# Patient Record
Sex: Female | Born: 1975 | Race: Black or African American | Hispanic: No | Marital: Single | Smoking: Never smoker
Health system: Southern US, Community
[De-identification: ages and names within clinical notes are randomized; demographics above are authoritative.]

## PROBLEM LIST (undated history)

## (undated) DIAGNOSIS — E669 Obesity, unspecified: Secondary | ICD-10-CM

---

## 2002-07-06 ENCOUNTER — Emergency Department (HOSPITAL_COMMUNITY): Admission: EM | Admit: 2002-07-06 | Discharge: 2002-07-07 | Payer: Self-pay | Admitting: Emergency Medicine

## 2006-07-17 ENCOUNTER — Other Ambulatory Visit: Admission: RE | Admit: 2006-07-17 | Discharge: 2006-07-17 | Payer: Self-pay | Admitting: Family Medicine

## 2012-01-25 ENCOUNTER — Ambulatory Visit: Payer: BC Managed Care – PPO

## 2012-01-25 ENCOUNTER — Ambulatory Visit (INDEPENDENT_AMBULATORY_CARE_PROVIDER_SITE_OTHER): Payer: BC Managed Care – PPO | Admitting: Family Medicine

## 2012-01-25 VITALS — BP 110/77 | HR 84 | Temp 97.4°F | Resp 16

## 2012-01-25 DIAGNOSIS — R52 Pain, unspecified: Secondary | ICD-10-CM

## 2012-01-25 DIAGNOSIS — W19XXXA Unspecified fall, initial encounter: Secondary | ICD-10-CM

## 2012-01-25 DIAGNOSIS — S93409A Sprain of unspecified ligament of unspecified ankle, initial encounter: Secondary | ICD-10-CM

## 2012-01-25 DIAGNOSIS — M25571 Pain in right ankle and joints of right foot: Secondary | ICD-10-CM

## 2012-01-25 DIAGNOSIS — S93401A Sprain of unspecified ligament of right ankle, initial encounter: Secondary | ICD-10-CM

## 2012-01-25 DIAGNOSIS — M25579 Pain in unspecified ankle and joints of unspecified foot: Secondary | ICD-10-CM

## 2012-01-25 NOTE — Progress Notes (Signed)
   [address]   [place], [place]  [address]   [id number]  Subjective:    Patient ID: Carol Bullock, female    DOB: 02/11/1976, 36 y.o.   MRN: [id number]  HPIThis 36 y.o. female presents for evaluation of ankle pain. Larey Seat one week ago and injured ankle.  Walking to class and twisted; twisted R ankle.  +swelling.  +limping.  +entire foot hurts.  +tingling and numbness in foot.  No cyanosis.  No medications.  Minimal pain immediately but pain developed the following day.  Wrapping in ace bandage.  +icing ankle.  No elevation.  Still walking on ankle.  +previous ankle injury in past; sprain in past.  PCP:  none PMH:  Regular menses; LMP this week. Psurg:  none All: none Medications:  None Social: Tree surgeon debt.    Review of Systems  Constitutional: Negative for fever, chills and diaphoresis.  Musculoskeletal: Positive for arthralgias and gait problem.  Neurological: Positive for numbness. Negative for weakness.  Hematological: Does not bruise/bleed easily.       Objective:   Physical Exam  Nursing note and vitals reviewed. Constitutional: She is oriented to person, place, and time. She appears well-developed and well-nourished. No distress.  Cardiovascular: Intact distal pulses.        DP PULSE 2+.  CAPILLARY REFILL < 3 SECONDS.  Musculoskeletal: She exhibits tenderness.       Right ankle: She exhibits decreased range of motion and swelling. She exhibits no ecchymosis, no deformity, no laceration and normal pulse. tenderness. Lateral malleolus and head of 5th metatarsal tenderness found. No medial malleolus and no proximal fibula tenderness found. Achilles tendon exhibits no pain and no defect.       +PAIN WITH DORSIFLEXION, PLANTAR FLEXION, INTERNAL AND EXTERNAL ROTATION R ANKLE; +TTP PROXIMAL 5TH METATARSAL; MILD EFFUSION LATERAL ANKLE.  NO TTP CALF SQUEEZE OR PALPATION.  Neurological: She is alert and oriented to person, place, and time. No sensory deficit.  Skin: Skin is  warm and dry. She is not diaphoretic. No erythema. No pallor.  Psychiatric: She has a normal mood and affect. Her behavior is normal. Judgment and thought content normal.      [facility] reading (PRIMARY) by  Dr. Katrinka Blazing.  R ankle: NAD.  R foot:  NAD.      Assessment & Plan:   1. Fall  DG Ankle Complete Right, DG Foot Complete Right  2. Pain  DG Ankle Complete Right, DG Foot Complete Right  3. Right ankle sprain    4. Right ankle pain       1.  R lateral ankle pain/sprain:  New.  Rest, ice, elevate, NSAIDS scheduled for next week.  Sweedo lace up brace provided to wear daily for 1-2 weeks.  Avoid wearing high heeled shoes for two weeks; supportive shoes with ambulation for next two weeks. Home exercises provided to perform daily.  RTC two weeks if no improvement.

## 2012-01-25 NOTE — Patient Instructions (Addendum)
1. Fall  DG Ankle Complete Right, DG Foot Complete Right  2. Pain  DG Ankle Complete Right, DG Foot Complete Right  3. Right ankle sprain    4. Right ankle pain     Acute Ankle Sprain with Phase I Rehab An acute ankle sprain is a partial or complete tear in one or more of the ligaments of the ankle due to traumatic injury. The severity of the injury depends on both the the number of ligaments sprained and the grade of sprain. There are 3 grades of sprains.   A grade 1 sprain is a mild sprain. There is a slight pull without obvious tearing. There is no loss of strength, and the muscle and ligament are the correct length.   A grade 2 sprain is a moderate sprain. There is tearing of fibers within the substance of the ligament where it connects two bones or two cartilages. The length of the ligament is increased, and there is usually decreased strength.   A grade 3 sprain is a complete rupture of the ligament and is uncommon.  In addition to the grade of sprain, there are three types of ankle sprains.  Lateral ankle sprains: This is a sprain of one or more of the three ligaments on the outer side (lateral) of the ankle. These are the most common sprains. Medial ankle sprains: There is one large triangular ligament of the inner side (medial) of the ankle that is susceptible to injury. Medial ankle sprains are less common. Syndesmosis, "high ankle," sprains: The syndesmosis is the ligament that connects the two bones of the lower leg. Syndesmosis sprains usually only occur with very severe ankle sprains. SYMPTOMS  Pain, tenderness, and swelling in the ankle, starting at the side of injury that may progress to the whole ankle and foot with time.   "Pop" or tearing sensation at the time of injury.   Bruising that may spread to the heel.   Impaired ability to walk soon after injury.  CAUSES   Acute ankle sprains are caused by trauma placed on the ankle that temporarily forces or pries the  anklebone (talus) out of its normal socket.   Stretching or tearing of the ligaments that normally hold the joint in place (usually due to a twisting injury).  RISK INCREASES WITH:  Previous ankle sprain.   Sports in which the foot may land awkwardly (ie. basketball, volleyball, or soccer) or walking or running on uneven or rough surfaces.   Shoes with inadequate support to prevent sideways motion when stress occurs.   Poor strength and flexibility.   Poor balance skills.   Contact sports.  PREVENTION   Warm up and stretch properly before activity.   Maintain physical fitness:   Ankle and leg flexibility, muscle strength, and endurance.   Cardiovascular fitness.   Balance training activities.   Use proper technique and have a coach correct improper technique.   Taping, protective strapping, bracing, or high-top tennis shoes may help prevent injury. Initially, tape is best; however, it loses most of its support function within 10 to 15 minutes.   Wear proper fitted protective shoes (High-top shoes with taping or bracing is more effective than either alone).   Provide the ankle with support during sports and practice activities for 12 months following injury.  PROGNOSIS   If treated properly, ankle sprains can be expected to recover completely; however, the length of recovery depends on the degree of injury.   A grade 1 sprain usually heals  enough in 5 to 7 days to allow modified activity and requires an average of 6 weeks to heal completely.   A grade 2 sprain requires 6 to 10 weeks to heal completely.   A grade 3 sprain requires 12 to 16 weeks to heal.   A syndesmosis sprain often takes more than 3 months to heal.  RELATED COMPLICATIONS   Frequent recurrence of symptoms may result in a chronic problem. Appropriately addressing the problem the first time decreases the frequency of recurrence and optimizes healing time. Severity of the initial sprain does not predict the  likelihood of later instability.   Injury to other structures (bone, cartilage, or tendon).   A chronically unstable or arthritic ankle joint is a possiblity with repeated sprains.  TREATMENT Treatment initially involves the use of ice, medication, and compression bandages to help reduce pain and inflammation. Ankle sprains are usually immobilized in a walking cast or boot to allow for healing. Crutches may be recommended to reduce pressure on the injury. After immobilization, strengthening and stretching exercises may be necessary to regain strength and a full range of motion. Surgery is rarely needed to treat ankle sprains. MEDICATION   Nonsteroidal anti-inflammatory medications, such as aspirin and ibuprofen (do not take for the first 3 days after injury or within 7 days before surgery), or other minor pain relievers, such as acetaminophen, are often recommended. Take these as directed by your caregiver. Contact your caregiver immediately if any bleeding, stomach upset, or signs of an allergic reaction occur from these medications.   Ointments applied to the skin may be helpful.   Pain relievers may be prescribed as necessary by your caregiver. Do not take prescription pain medication for longer than 4 to 7 days. Use only as directed and only as much as you need.  HEAT AND COLD  Cold treatment (icing) is used to relieve pain and reduce inflammation for acute and chronic cases. Cold should be applied for 10 to 15 minutes every 2 to 3 hours for inflammation and pain and immediately after any activity that aggravates your symptoms. Use ice packs or an ice massage.   Heat treatment may be used before performing stretching and strengthening activities prescribed by your caregiver. Use a heat pack or a warm soak.  SEEK IMMEDIATE MEDICAL CARE IF:   Pain, swelling, or bruising worsens despite treatment.   You experience pain, numbness, discoloration, or coldness in the foot or toes.   New,  unexplained symptoms develop (drugs used in treatment may produce side effects.)  EXERCISES  PHASE I EXERCISES RANGE OF MOTION (ROM) AND STRETCHING EXERCISES - Ankle Sprain, Acute Phase I, Weeks 1 to 2 These exercises may help you when beginning to restore flexibility in your ankle. You will likely work on these exercises for the 1 to 2 weeks after your injury. Once your physician, physical therapist, or athletic trainer sees adequate progress, he or she will advance your exercises. While completing these exercises, remember:   Restoring tissue flexibility helps normal motion to return to the joints. This allows healthier, less painful movement and activity.   An effective stretch should be held for at least 30 seconds.   A stretch should never be painful. You should only feel a gentle lengthening or release in the stretched tissue.  RANGE OF MOTION - Dorsi/Plantar Flexion  While sitting with your right / left knee straight, draw the top of your foot upwards by flexing your ankle. Then reverse the motion, pointing your toes downward.  Hold each position for __________ seconds.   After completing your first set of exercises, repeat this exercise with your knee bent.  Repeat __________ times. Complete this exercise __________ times per day.  RANGE OF MOTION - Ankle Alphabet  Imagine your right / left big toe is a pen.   Keeping your hip and knee still, write out the entire alphabet with your "pen." Make the letters as large as you can without increasing any discomfort.  Repeat __________ times. Complete this exercise __________ times per day.  STRENGTHENING EXERCISES - Ankle Sprain, Acute -Phase I, Weeks 1 to 2 These exercises may help you when beginning to restore strength in your ankle. You will likely work on these exercises for 1 to 2 weeks after your injury. Once your physician, physical therapist, or athletic trainer sees adequate progress, he or she will advance your exercises. While  completing these exercises, remember:   Muscles can gain both the endurance and the strength needed for everyday activities through controlled exercises.   Complete these exercises as instructed by your physician, physical therapist, or athletic trainer. Progress the resistance and repetitions only as guided.   You may experience muscle soreness or fatigue, but the pain or discomfort you are trying to eliminate should never worsen during these exercises. If this pain does worsen, stop and make certain you are following the directions exactly. If the pain is still present after adjustments, discontinue the exercise until you can discuss the trouble with your clinician.  STRENGTH - Dorsiflexors  Secure a rubber exercise band/tubing to a fixed object (ie. table, pole) and loop the other end around your right / left foot.   Sit on the floor facing the fixed object. The band/tubing should be slightly tense when your foot is relaxed.   Slowly draw your foot back toward you using your ankle and toes.   Hold this position for __________ seconds. Slowly release the tension in the band and return your foot to the starting position.  Repeat __________ times. Complete this exercise __________ times per day.  STRENGTH - Plantar-flexors   Sit with your right / left leg extended. Holding onto both ends of a rubber exercise band/tubing, loop it around the ball of your foot. Keep a slight tension in the band.   Slowly push your toes away from you, pointing them downward.   Hold this position for __________ seconds. Return slowly, controlling the tension in the band/tubing.  Repeat __________ times. Complete this exercise __________ times per day.  STRENGTH - Ankle Eversion  Secure one end of a rubber exercise band/tubing to a fixed object (table, pole). Loop the other end around your foot just before your toes.   Place your fists between your knees. This will focus your strengthening at your ankle.    Drawing the band/tubing across your opposite foot, slowly, pull your little toe out and up. Make sure the band/tubing is positioned to resist the entire motion.   Hold this position for __________ seconds.  Have your muscles resist the band/tubing as it slowly pulls your foot back to the starting position.  Repeat __________ times. Complete this exercise __________ times per day.  STRENGTH - Ankle Inversion  Secure one end of a rubber exercise band/tubing to a fixed object (table, pole). Loop the other end around your foot just before your toes.   Place your fists between your knees. This will focus your strengthening at your ankle.   Slowly, pull your big toe up and in,  making sure the band/tubing is positioned to resist the entire motion.   Hold this position for __________ seconds.   Have your muscles resist the band/tubing as it slowly pulls your foot back to the starting position.  Repeat __________ times. Complete this exercises __________ times per day.  STRENGTH - Towel Curls  Sit in a chair positioned on a non-carpeted surface.   Place your right / left foot on a towel, keeping your heel on the floor.   Pull the towel toward your heel by only curling your toes. Keep your heel on the floor.   If instructed by your physician, physical therapist, or athletic trainer, add weight to the end of the towel.  Repeat __________ times. Complete this exercise __________ times per day. Document Released: 11/23/2004 Document Revised: 04/13/2011 Document Reviewed: 08/06/2008 [facility] Patient Information 2012 [place], [place].

## 2012-01-29 NOTE — Progress Notes (Signed)
Reviewed and agree.

## 2018-12-13 ENCOUNTER — Telehealth: Payer: Self-pay | Admitting: General Practice

## 2018-12-13 NOTE — Telephone Encounter (Signed)
Tried to call but was unable to leave a message on any phone

## 2018-12-13 NOTE — Telephone Encounter (Signed)
Copied from [place] [id number]. Topic: Appointment Scheduling - Scheduling Inquiry for Clinic >> Dec 12, 2018  9:49 AM Erick Blinks wrote: Reason for CRM: Pt called requesting to est care with Dr. Volanda Napoleon, tried calling office  Best contact: [phone]

## 2019-08-28 ENCOUNTER — Emergency Department (HOSPITAL_COMMUNITY): Payer: BC Managed Care – PPO

## 2019-08-28 ENCOUNTER — Observation Stay (HOSPITAL_COMMUNITY)
Admission: EM | Admit: 2019-08-28 | Discharge: 2019-08-29 | Disposition: A | Discharge status: Home / Self Care | Payer: BC Managed Care – PPO | Attending: Internal Medicine | Admitting: Internal Medicine

## 2019-08-28 ENCOUNTER — Other Ambulatory Visit: Payer: Self-pay

## 2019-08-28 ENCOUNTER — Encounter (HOSPITAL_COMMUNITY): Payer: Self-pay | Admitting: Emergency Medicine

## 2019-08-28 DIAGNOSIS — E6609 Other obesity due to excess calories: Secondary | ICD-10-CM

## 2019-08-28 DIAGNOSIS — Z6837 Body mass index (BMI) 37.0-37.9, adult: Secondary | ICD-10-CM | POA: Diagnosis not present

## 2019-08-28 DIAGNOSIS — Z20822 Contact with and (suspected) exposure to covid-19: Secondary | ICD-10-CM | POA: Diagnosis not present

## 2019-08-28 DIAGNOSIS — R55 Syncope and collapse: Secondary | ICD-10-CM | POA: Diagnosis not present

## 2019-08-28 DIAGNOSIS — E669 Obesity, unspecified: Secondary | ICD-10-CM | POA: Diagnosis present

## 2019-08-28 HISTORY — DX: Obesity, unspecified: E66.9

## 2019-08-28 LAB — CBC WITH DIFFERENTIAL/PLATELET
Abs Immature Granulocytes: 0.01 10*3/uL (ref 0.00–0.07)
Basophils Absolute: 0.1 10*3/uL (ref 0.0–0.1)
Basophils Relative: 1 %
Eosinophils Absolute: 0.3 10*3/uL (ref 0.0–0.5)
Eosinophils Relative: 7 %
HCT: 40.7 % (ref 36.0–46.0)
Hemoglobin: 12.8 g/dL (ref 12.0–15.0)
Immature Granulocytes: 0 %
Lymphocytes Relative: 46 %
Lymphs Abs: 2.1 10*3/uL (ref 0.7–4.0)
MCH: 29 pg (ref 26.0–34.0)
MCHC: 31.4 g/dL (ref 30.0–36.0)
MCV: 92.3 fL (ref 80.0–100.0)
Monocytes Absolute: 0.4 10*3/uL (ref 0.1–1.0)
Monocytes Relative: 8 %
Neutro Abs: 1.8 10*3/uL (ref 1.7–7.7)
Neutrophils Relative %: 38 %
Platelets: 278 10*3/uL (ref 150–400)
RBC: 4.41 MIL/uL (ref 3.87–5.11)
RDW: 13.5 % (ref 11.5–15.5)
WBC: 4.7 10*3/uL (ref 4.0–10.5)
nRBC: 0 % (ref 0.0–0.2)

## 2019-08-28 LAB — HIV ANTIBODY (ROUTINE TESTING W REFLEX): HIV Screen 4th Generation wRfx: NONREACTIVE

## 2019-08-28 LAB — BASIC METABOLIC PANEL
Anion gap: 8 (ref 5–15)
BUN: 5 mg/dL — ABNORMAL LOW (ref 6–20)
CO2: 25 mmol/L (ref 22–32)
Calcium: 9.1 mg/dL (ref 8.9–10.3)
Chloride: 111 mmol/L (ref 98–111)
Creatinine, Ser: 0.7 mg/dL (ref 0.44–1.00)
GFR calc Af Amer: >60 mL/min (ref >60)
GFR calc non Af Amer: >60 mL/min (ref >60)
Glucose, Bld: 79 mg/dL (ref 70–99)
Potassium: 3.6 mmol/L (ref 3.5–5.1)
Sodium: 144 mmol/L (ref 135–145)

## 2019-08-28 LAB — I-STAT BETA HCG BLOOD, ED (MC, WL, AP ONLY): I-stat hCG, quantitative: <5 m[IU]/mL (ref <5)

## 2019-08-28 LAB — URINALYSIS, ROUTINE W REFLEX MICROSCOPIC
Bilirubin Urine: NEGATIVE
Glucose, UA: NEGATIVE mg/dL
Hgb urine dipstick: NEGATIVE
Ketones, ur: NEGATIVE mg/dL
Leukocytes,Ua: NEGATIVE
Nitrite: NEGATIVE
Protein, ur: NEGATIVE mg/dL
Specific Gravity, Urine: 1.003 — ABNORMAL LOW (ref 1.005–1.030)
pH: 6 (ref 5.0–8.0)

## 2019-08-28 LAB — CBG MONITORING, ED: Glucose-Capillary: 79 mg/dL (ref 70–99)

## 2019-08-28 LAB — TROPONIN I (HIGH SENSITIVITY)
Troponin I (High Sensitivity): 4 ng/L (ref <18)
Troponin I (High Sensitivity): <2 ng/L (ref <18)

## 2019-08-28 LAB — RESPIRATORY PANEL BY RT PCR (FLU A&B, COVID)
Influenza A by PCR: NEGATIVE
Influenza B by PCR: NEGATIVE
SARS Coronavirus 2 by RT PCR: NEGATIVE

## 2019-08-28 LAB — TSH: TSH: 0.644 u[IU]/mL (ref 0.350–4.500)

## 2019-08-28 LAB — GLUCOSE, CAPILLARY: Glucose-Capillary: 102 mg/dL — ABNORMAL HIGH (ref 70–99)

## 2019-08-28 LAB — D-DIMER, QUANTITATIVE: D-Dimer, Quant: 0.34 ug/mL-FEU (ref 0.00–0.50)

## 2019-08-28 LAB — MAGNESIUM: Magnesium: 1.9 mg/dL (ref 1.7–2.4)

## 2019-08-28 MED ORDER — ENOXAPARIN SODIUM 40 MG/0.4ML ~~LOC~~ SOLN
40.0000 mg | SUBCUTANEOUS | Status: DC
  Administered 2019-08-28 – 2019-08-29 (×2): 40 mg via SUBCUTANEOUS
  Filled 2019-08-28 (×2): qty 0.4

## 2019-08-28 MED ORDER — ACETAMINOPHEN 325 MG PO TABS
650.0000 mg | ORAL_TABLET | Freq: Four times a day (QID) | ORAL | Status: DC | PRN
  Administered 2019-08-29: 650 mg via ORAL
  Filled 2019-08-28: qty 2

## 2019-08-28 MED ORDER — ONDANSETRON HCL 4 MG PO TABS: 4.0000 mg | ORAL_TABLET | Freq: Four times a day (QID) | ORAL | Status: DC | PRN

## 2019-08-28 MED ORDER — ACETAMINOPHEN 650 MG RE SUPP: 650.0000 mg | Freq: Four times a day (QID) | RECTAL | Status: DC | PRN

## 2019-08-28 MED ORDER — SODIUM CHLORIDE 0.9 % IV SOLN: INTRAVENOUS | Status: DC

## 2019-08-28 MED ORDER — SODIUM CHLORIDE 0.9% FLUSH
3.0000 mL | Freq: Two times a day (BID) | INTRAVENOUS | Status: DC
  Administered 2019-08-28 – 2019-08-29 (×2): 3 mL via INTRAVENOUS

## 2019-08-28 MED ORDER — ALBUTEROL SULFATE (2.5 MG/3ML) 0.083% IN NEBU: 2.5000 mg | INHALATION_SOLUTION | Freq: Four times a day (QID) | RESPIRATORY_TRACT | Status: DC | PRN

## 2019-08-28 MED ORDER — ONDANSETRON HCL 4 MG/2ML IJ SOLN: 4.0000 mg | Freq: Four times a day (QID) | INTRAMUSCULAR | Status: DC | PRN

## 2019-08-28 NOTE — ED Notes (Signed)
Pt ambulated to br

## 2019-08-28 NOTE — ED Notes (Signed)
Attempted report x1. 

## 2019-08-28 NOTE — ED Triage Notes (Signed)
Pt arrives via EMS - Pt had a brief syncopal episode while at work. Her staff caught her as she was passing out. Pt did not hit the floor. Pt complains of mild dizziness. EMS reports that while they were helping her stand to get on stretcher, pt's legs went limp and they couldn't feel a radial pulse. Pt was still alert and remembers this second incident. BP 130/80 after the event, HR 70 regular, CBG 111, 100% on room air. No medical hx. Pt had her second covid shot two weeks ago.
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
## 2019-08-28 NOTE — H&P (Signed)
History and Physical    Carol Bullock QPY:[id number] DOB: Mar 08, 1976 DOA: 08/28/2019  Referring MD/NP/PA: Casimiro Needle tried: PCP: Osborn Coho, MD  Patient coming from: Work Via EMS  Chief Complaint: Passed out  I have personally briefly reviewed patient's old medical records in [facility]   HPI: Carol Bullock is a 44 y.o. female with past medical history significant for obesity presents after having a syncopal episode at work.  She reports that she was talking with coworkers when she felt herself get lightheaded, but was able to catch herself against a nearby wall.  However, when she let go of the wall she apparently lost consciousness and coworkers were able to catch her before she fell.  Thinks that she may have lost consciousness for only a couple seconds.  Associated symptoms included nausea and headache.  Denies having any chest pain, palpitations, nausea, vomiting, diarrhea, or leg swelling.  She had recently received the Pfizer vaccine for Covid 2 weeks ago.  She reports that she has been drinking plenty of fluids at least in the last few days because she has had muscle aches of both of her thighs. She makes note that her mother has history of possible atrial fibrillation or irregular heart rhythm. The only other recent changes is that she is switched to vegan and changed her eating habits.  ED Course: Upon admission into the emergency department patient was noted to have relatively normal vital signs.  Orthostatics vital signs were also noted to be negative.  Labs were relatively unremarkable except for low normal glucose of 70.  Urinalysis was negative for any acute abnormalities.  Chest x-ray noted low lung volumes with bibasilar atelectasis.  Influenza and COVID-19 screening were both negative.  TRH called to admit.  Review of Systems  Constitutional: Negative for fever.  HENT: Negative for nosebleeds and sinus pain.   Eyes: Negative for pain.  Respiratory: Negative for  cough and wheezing.   Cardiovascular: Negative for chest pain and palpitations.  Gastrointestinal: Positive for nausea. Negative for vomiting.  Genitourinary: Negative for dysuria and flank pain.  Musculoskeletal: Negative for back pain and falls.  Skin: Negative for itching and rash.  Neurological: Positive for dizziness, loss of consciousness and headaches.  Psychiatric/Behavioral: Negative for memory loss and substance abuse.    History reviewed. No pertinent past medical history.  History reviewed. No pertinent surgical history.   reports that she has never smoked. She has never used smokeless tobacco. She reports current alcohol use. She reports that she does not use drugs.  No Known Allergies  History reviewed. No pertinent family history.  Prior to Admission medications   Not on File    Physical Exam:  Constitutional: Middle-aged female who appears to be in no acute distress at this time. Vitals:   08/28/19 1015 08/28/19 1215 08/28/19 1300 08/28/19 1315  BP: 124/73 (!) 127/92 (!) 117/58 (!) 106/51  Pulse: (!) 58 75 66 61  Resp: 15 15 19  (!) 21  Temp:      TempSrc:      SpO2: 99% 100% 98% 96%  Weight:      Height:       Eyes: PERRL, lids and conjunctivae normal ENMT: Mucous membranes are moist. Posterior pharynx clear of any exudate or lesions. Neck: normal, supple, no masses, no thyromegaly Respiratory: clear to auscultation bilaterally, no wheezing, no crackles. Normal respiratory effort. No accessory muscle use.  Cardiovascular: Regular rate and rhythm, no murmurs / rubs / gallops. No extremity edema.  2+ pedal pulses. No carotid bruits.  Abdomen: no tenderness, no masses palpated. No hepatosplenomegaly. Bowel sounds positive.  Musculoskeletal: no clubbing / cyanosis. No joint deformity upper and lower extremities. Good ROM, no contractures. Normal muscle tone.  Skin: no rashes, lesions, ulcers. No induration Neurologic: CN 2-12 grossly intact. Sensation intact,  DTR normal. Strength 5/5 in all 4.  Psychiatric: Normal judgment and insight. Alert and oriented x 3. Normal mood.     Labs on Admission: I have personally reviewed following labs and imaging studies  CBC: Recent Labs  Lab 08/28/19 0952  WBC 4.7  NEUTROABS 1.8  HGB 12.8  HCT 40.7  MCV 92.3  PLT 937   Basic Metabolic Panel: Recent Labs  Lab 08/28/19 0952  NA 144  K 3.6  CL 111  CO2 25  GLUCOSE 79  BUN 5*  CREATININE 0.70  CALCIUM 9.1  MG 1.9   GFR: Estimated Creatinine Clearance: 107.4 mL/min (by C-G formula based on SCr of 0.7 mg/dL). Liver Function Tests: No results for input(s): AST, ALT, ALKPHOS, BILITOT, PROT, ALBUMIN in the last 168 hours. No results for input(s): LIPASE, AMYLASE in the last 168 hours. No results for input(s): AMMONIA in the last 168 hours. Coagulation Profile: No results for input(s): INR, PROTIME in the last 168 hours. Cardiac Enzymes: No results for input(s): CKTOTAL, CKMB, CKMBINDEX, TROPONINI in the last 168 hours. BNP (last 3 results) No results for input(s): PROBNP in the last 8760 hours. HbA1C: No results for input(s): HGBA1C in the last 72 hours. CBG: Recent Labs  Lab 08/28/19 1011  GLUCAP 79   Lipid Profile: No results for input(s): CHOL, HDL, LDLCALC, TRIG, CHOLHDL, LDLDIRECT in the last 72 hours. Thyroid Function Tests: No results for input(s): TSH, T4TOTAL, FREET4, T3FREE, THYROIDAB in the last 72 hours. Anemia Panel: No results for input(s): VITAMINB12, FOLATE, FERRITIN, TIBC, IRON, RETICCTPCT in the last 72 hours. Urine analysis:    Component Value Date/Time   COLORURINE STRAW (A) 08/28/2019 1015   APPEARANCEUR CLEAR 08/28/2019 1015   LABSPEC 1.003 (L) 08/28/2019 1015   PHURINE 6.0 08/28/2019 1015   GLUCOSEU NEGATIVE 08/28/2019 1015   HGBUR NEGATIVE 08/28/2019 1015   BILIRUBINUR NEGATIVE 08/28/2019 1015   KETONESUR NEGATIVE 08/28/2019 1015   PROTEINUR NEGATIVE 08/28/2019 1015   NITRITE NEGATIVE 08/28/2019 1015     LEUKOCYTESUR NEGATIVE 08/28/2019 1015   Sepsis Labs: No results found for this or any previous visit (from the past 240 hour(s)).   Radiological Exams on Admission: CT Head Wo Contrast  Result Date: 08/28/2019 CLINICAL DATA:  44 year old presenting with syncope. EXAM: CT HEAD WITHOUT CONTRAST TECHNIQUE: Contiguous axial images were obtained from the base of the skull through the vertex without intravenous contrast. COMPARISON:  None. FINDINGS: Brain: Head tilt in the gantry accounts for apparent asymmetry in the cerebral hemispheres. Ventricular system normal in size and appearance for age. No mass lesion. No midline shift. No acute hemorrhage or hematoma. No extra-axial fluid collections. No evidence of acute infarction. No focal brain parenchymal abnormalities. Calcifications/ossification involving the falx and the tentorium. Vascular: No hyperdense vessel. No visible atherosclerosis. Skull: No skull fracture or other focal osseous abnormality involving the skull. Sinuses/Orbits: Visualized paranasal sinuses, bilateral mastoid air cells and bilateral middle ear cavities well-aerated. Visualized orbits and globes normal in appearance. Other: None. IMPRESSION: No acute intracranial abnormality. Electronically Signed   By: Evangeline Dakin M.D.   On: 08/28/2019 11:36   DG Chest Portable 1 View  Result Date: 08/28/2019 CLINICAL DATA:  Patient with syncopal episode. EXAM: PORTABLE CHEST 1 VIEW COMPARISON:  None. FINDINGS: Monitoring leads overlie the patient. Cardiac contours upper limits of normal. Low lung volumes. Bibasilar heterogeneous opacities. No pleural effusion or pneumothorax. Rightward curvature of the thoracic spine. IMPRESSION: Low lung volumes with basilar opacities favored to represent atelectasis. Electronically Signed   By: Annia Belt M.D.   On: 08/28/2019 10:41    EKG: Independently reviewed.  Sinus rhythm at 61 bpm  Assessment/Plan Syncope: Acute.  Patient presents after having  a syncopal episode at work.  Denies any reports of chest pain.  Chest x-ray was clear and patient had negative orthostatic vital signs.  Troponins negative x2.  Reports changing to a vegan diet recently.  Initial blood sugars were at the lower end of normal.  Patient also noted history of arrhythmia in her mother.  Question cause of symptoms at this time as it is not totally clear. -Admit to a medical telemetry bed -Check echocardiogram -Normal saline IV fluids at 75 mL/h overnight -Check CBGs q. 4 hours x 4 with hypoglycemic protocol -Follow-up telemetry overnight -May benefit from being set up for outpatient Holter monitor  -Will need further education on restrictions driving and other safety measures if no clear cause is able to be found  Obesity: BMI of 37.44 kg/m  DVT prophylaxis: lovenox  Code Status: full  Family Communication: No family present at bedside Disposition Plan: Likely discharge home in a.m. depending on work-up Consults called: none  Admission status: Observation  Clydie Braun MD [facility] Pager [phone]   If 7PM-7AM, please contact night-coverage [email or web address] Password [email or web address]  08/28/2019, 2:25 PM

## 2019-08-28 NOTE — Progress Notes (Signed)
Attempted to call back ED nurse to get report but she gave a fax number.

## 2019-08-29 ENCOUNTER — Observation Stay (HOSPITAL_BASED_OUTPATIENT_CLINIC_OR_DEPARTMENT_OTHER): Payer: BC Managed Care – PPO

## 2019-08-29 ENCOUNTER — Encounter (HOSPITAL_COMMUNITY): Payer: Self-pay | Admitting: Surgery

## 2019-08-29 DIAGNOSIS — R55 Syncope and collapse: Secondary | ICD-10-CM

## 2019-08-29 DIAGNOSIS — Z6837 Body mass index (BMI) 37.0-37.9, adult: Secondary | ICD-10-CM | POA: Diagnosis not present

## 2019-08-29 DIAGNOSIS — E6609 Other obesity due to excess calories: Secondary | ICD-10-CM | POA: Diagnosis not present

## 2019-08-29 LAB — GLUCOSE, CAPILLARY
Glucose-Capillary: 100 mg/dL — ABNORMAL HIGH (ref 70–99)
Glucose-Capillary: 102 mg/dL — ABNORMAL HIGH (ref 70–99)
Glucose-Capillary: 111 mg/dL — ABNORMAL HIGH (ref 70–99)
Glucose-Capillary: 119 mg/dL — ABNORMAL HIGH (ref 70–99)

## 2019-08-29 LAB — ECHOCARDIOGRAM COMPLETE
Height: 65 in
Weight: 3600 oz

## 2019-08-29 LAB — CBC
HCT: 38.2 % (ref 36.0–46.0)
Hemoglobin: 12.3 g/dL (ref 12.0–15.0)
MCH: 29.1 pg (ref 26.0–34.0)
MCHC: 32.2 g/dL (ref 30.0–36.0)
MCV: 90.5 fL (ref 80.0–100.0)
Platelets: 274 10*3/uL (ref 150–400)
RBC: 4.22 MIL/uL (ref 3.87–5.11)
RDW: 13.5 % (ref 11.5–15.5)
WBC: 5.7 10*3/uL (ref 4.0–10.5)
nRBC: 0 % (ref 0.0–0.2)

## 2019-08-29 LAB — BASIC METABOLIC PANEL
Anion gap: 9 (ref 5–15)
BUN: 7 mg/dL (ref 6–20)
CO2: 24 mmol/L (ref 22–32)
Calcium: 8.7 mg/dL — ABNORMAL LOW (ref 8.9–10.3)
Chloride: 110 mmol/L (ref 98–111)
Creatinine, Ser: 0.6 mg/dL (ref 0.44–1.00)
GFR calc Af Amer: >60 mL/min (ref >60)
GFR calc non Af Amer: >60 mL/min (ref >60)
Glucose, Bld: 115 mg/dL — ABNORMAL HIGH (ref 70–99)
Potassium: 3.9 mmol/L (ref 3.5–5.1)
Sodium: 143 mmol/L (ref 135–145)

## 2019-08-29 NOTE — Progress Notes (Signed)
Patient discharged to home with instructions. 

## 2019-08-29 NOTE — Discharge Summary (Signed)
Physician Discharge Summary  KIARIA QUINNELL WUJ:[id number] DOB: 02-Nov-1975 DOA: 08/28/2019  PCP: Osborn Coho, MD  Admit date: 08/28/2019 Discharge date: 08/29/2019  Admitted From: Home  Disposition:  Home   Recommendations for Outpatient Follow-up and new medication changes:  1. Follow up with Dr. Su Hilt   Home Health: no  Equipment/Devices: no    Discharge Condition: stable  CODE STATUS: full  Diet recommendation: regular diet.   Brief/Interim Summary: 44 year old female who presented after syncope episode, she does have significant past medical history of obesity.  Apparently she was talking to her coworkers when she felt lightheaded, had disbalance, and briefly lost her consciousness, for 3 consecutive times.  Apparently she had also nausea and headache, and no frank prodromal symptoms.  On her initial physical examination blood pressure was 124/73, heart rate 58, respiratory 21, oxygen saturation 98%, her lungs are clear to auscultation bilaterally, heart S1-S2 present rhythmic, soft abdomen, no lower extremity edema.  Neurologically patient was nonfocal. Sodium 144, potassium 3.6, chloride 111, bicarb 25, glucose 79, BUN 5, creatinine 0.70, magnesium 1.9, troponin I 4, count 4.7, hemoglobin 12.8, hematocrit 40.7, platelets 178.  D-dimer 0.34.  TSH 0.644, hCG less than 5.  COVID-19 negative.  Urinalysis negative for infection.  Head CT negative for acute changes.  Chest radiograph with no significant infiltrates EKG 61 bpm, normal axis, normal intervals, sinus rhythm, no ST segment or T wave changes.  No Brugada criteria or QT prolongation.   Discharge Diagnoses:  Principal Problem:   Syncope Active Problems:   Obesity   1. Syncope. No alarm signs, patient now back to her normal state, no nausea or vomiting. She has ruled out for cardiac source of syncope. Echocardiogram with preserved LV systolic function. Telemetry with no arrhythmias.   2. Obesity class 2. BMI is 37,4. Will  need outpatient follow up.    Discharge Instructions   Allergies as of 08/29/2019      Reactions   Bee Pollen Other (See Comments), Cough   Sneezing, headaches      Medication List    TAKE these medications   acetaminophen 500 MG tablet Commonly known as: TYLENOL Take 500-1,000 mg by mouth every 6 (six) hours as needed for mild pain.   cetirizine 10 MG tablet Commonly known as: ZYRTEC Take 10 mg by mouth daily as needed for allergies.   ibuprofen 200 MG tablet Commonly known as: ADVIL Take 400 mg by mouth every 6 (six) hours as needed for moderate pain.   pseudoephedrine 30 MG tablet Commonly known as: SUDAFED Take 30 mg by mouth every 4 (four) hours as needed for congestion.       Allergies  Allergen Reactions  . Bee Pollen Other (See Comments) and Cough    Sneezing, headaches        Procedures/Studies: CT Head Wo Contrast  Result Date: 08/28/2019 CLINICAL DATA:  44 year old presenting with syncope. EXAM: CT HEAD WITHOUT CONTRAST TECHNIQUE: Contiguous axial images were obtained from the base of the skull through the vertex without intravenous contrast. COMPARISON:  None. FINDINGS: Brain: Head tilt in the gantry accounts for apparent asymmetry in the cerebral hemispheres. Ventricular system normal in size and appearance for age. No mass lesion. No midline shift. No acute hemorrhage or hematoma. No extra-axial fluid collections. No evidence of acute infarction. No focal brain parenchymal abnormalities. Calcifications/ossification involving the falx and the tentorium. Vascular: No hyperdense vessel. No visible atherosclerosis. Skull: No skull fracture or other focal osseous abnormality involving the skull. Sinuses/Orbits: Visualized  paranasal sinuses, bilateral mastoid air cells and bilateral middle ear cavities well-aerated. Visualized orbits and globes normal in appearance. Other: None. IMPRESSION: No acute intracranial abnormality. Electronically Signed   By: Hulan Saas M.D.   On: 08/28/2019 11:36   DG Chest Portable 1 View  Result Date: 08/28/2019 CLINICAL DATA:  Patient with syncopal episode. EXAM: PORTABLE CHEST 1 VIEW COMPARISON:  None. FINDINGS: Monitoring leads overlie the patient. Cardiac contours upper limits of normal. Low lung volumes. Bibasilar heterogeneous opacities. No pleural effusion or pneumothorax. Rightward curvature of the thoracic spine. IMPRESSION: Low lung volumes with basilar opacities favored to represent atelectasis. Electronically Signed   By: Annia Belt M.D.   On: 08/28/2019 10:41   ECHOCARDIOGRAM COMPLETE  Result Date: 08/29/2019    ECHOCARDIOGRAM REPORT   Patient Name:   JOHNICE RIEBE Date of Exam: 08/29/2019 Medical Rec #:  [id number]       Height:       65.0 in Accession #:    [id number]      Weight:       225.0 lb Date of Birth:  Oct 29, 1975       BSA:          2.080 m Patient Age:    44 years        BP:           122/71 mmHg Patient Gender: F               HR:           71 bpm. Exam Location:  Inpatient Procedure: 2D Echo, Cardiac Doppler and Color Doppler Indications:    Syncope 780.2/R55  History:        Patient has no prior history of Echocardiogram examinations.  Sonographer:    Ross Ludwig RDCS (AE) Referring Phys: [id number] [facility] A SMITH  Sonographer Comments: Patient is morbidly obese. IMPRESSIONS  1. AV sclerosis without stenosis small mean gradient 5 mmHg peak 9 mmHg AVA over 2 cm2. Left ventricular ejection fraction, by estimation, is 60 to 65%. The left ventricle has normal function. The left ventricle has no regional wall motion abnormalities. Left ventricular diastolic parameters were normal.  2. Right ventricular systolic function is normal. The right ventricular size is normal.  3. The mitral valve is normal in structure. No evidence of mitral valve regurgitation. No evidence of mitral stenosis.  4. The aortic valve is tricuspid. Aortic valve regurgitation is not visualized. No aortic stenosis is present.  5. The  inferior vena cava is normal in size with greater than 50% respiratory variability, suggesting right atrial pressure of 3 mmHg. FINDINGS  Left Ventricle: AV sclerosis without stenosis small mean gradient 5 mmHg peak 9 mmHg AVA over 2 cm2. Left ventricular ejection fraction, by estimation, is 60 to 65%. The left ventricle has normal function. The left ventricle has no regional wall motion abnormalities. The left ventricular internal cavity size was normal in size. There is no left ventricular hypertrophy. Left ventricular diastolic parameters were normal. Right Ventricle: The right ventricular size is normal. No increase in right ventricular wall thickness. Right ventricular systolic function is normal. Left Atrium: Left atrial size was normal in size. Right Atrium: Right atrial size was normal in size. Pericardium: There is no evidence of pericardial effusion. Mitral Valve: The mitral valve is normal in structure. There is mild thickening of the mitral valve leaflet(s). There is mild calcification of the mitral valve leaflet(s). Normal mobility of the mitral valve leaflets. No evidence  of mitral valve regurgitation. No evidence of mitral valve stenosis. MV peak gradient, 3.8 mmHg. The mean mitral valve gradient is 1.0 mmHg. Tricuspid Valve: The tricuspid valve is normal in structure. Tricuspid valve regurgitation is trivial. No evidence of tricuspid stenosis. Aortic Valve: The aortic valve is tricuspid. Aortic valve regurgitation is not visualized. No aortic stenosis is present. Aortic valve mean gradient measures 5.0 mmHg. Aortic valve peak gradient measures 9.2 mmHg. Aortic valve area, by VTI measures 2.04 cm. Pulmonic Valve: The pulmonic valve was normal in structure. Pulmonic valve regurgitation is not visualized. No evidence of pulmonic stenosis. Aorta: The aortic root is normal in size and structure. Venous: The inferior vena cava is normal in size with greater than 50% respiratory variability, suggesting  right atrial pressure of 3 mmHg. IAS/Shunts: The interatrial septum was not well visualized.  LEFT VENTRICLE PLAX 2D LVIDd:         4.22 cm  Diastology LVIDs:         2.97 cm  LV e' lateral:   11.00 cm/s LV PW:         1.20 cm  LV E/e' lateral: 8.1 LV IVS:        0.79 cm  LV e' medial:    9.46 cm/s LVOT diam:     2.00 cm  LV E/e' medial:  9.4 LV SV:         64 LV SV Index:   31 LVOT Area:     3.14 cm  RIGHT VENTRICLE             IVC RV Mid diam:    2.31 cm     IVC diam: 1.17 cm RV S prime:     11.10 cm/s TAPSE (M-mode): 2.5 cm LEFT ATRIUM             Index       RIGHT ATRIUM           Index LA diam:        2.70 cm 1.30 cm/m  RA Area:     11.90 cm LA Vol (A2C):   43.8 ml 21.06 ml/m RA Volume:   27.30 ml  13.13 ml/m LA Vol (A4C):   29.8 ml 14.33 ml/m LA Biplane Vol: 36.7 ml 17.65 ml/m  AORTIC VALVE AV Area (Vmax):    2.11 cm AV Area (Vmean):   1.91 cm AV Area (VTI):     2.04 cm AV Vmax:           152.00 cm/s AV Vmean:          109.000 cm/s AV VTI:            0.314 m AV Peak Grad:      9.2 mmHg AV Mean Grad:      5.0 mmHg LVOT Vmax:         102.00 cm/s LVOT Vmean:        66.300 cm/s LVOT VTI:          0.204 m LVOT/AV VTI ratio: 0.65  AORTA Ao Root diam: 2.70 cm Ao Asc diam:  2.70 cm MITRAL VALVE MV Area (PHT): 4.10 cm    SHUNTS MV Peak grad:  3.8 mmHg    Systemic VTI:  0.20 m MV Mean grad:  1.0 mmHg    Systemic Diam: 2.00 cm MV Vmax:       0.97 m/s MV Vmean:      57.0 cm/s MV Decel Time: 185 msec MV E velocity: 88.70 cm/s  MV A velocity: 84.80 cm/s MV E/A ratio:  1.05 Charlton Haws MD Electronically signed by Charlton Haws MD Signature Date/Time: 08/29/2019/12:02:19 PM    Final         Subjective: Patient is feeling well, back to her normal self, with no further syncope episode or dizziness. No nausea or vomiting and tolerating po well.   Discharge Exam: Vitals:   08/29/19 0025 08/29/19 0410  BP: 95/60 122/71  Pulse: 65 71  Resp: 16 17  Temp: 98.1 F (36.7 C) 97.6 F (36.4 C)  SpO2: 99% 97%    Vitals:   08/28/19 1808 08/28/19 2122 08/29/19 0025 08/29/19 0410  BP: 101/71 116/64 95/60 122/71  Pulse: 75 79 65 71  Resp: 18 17 16 17   Temp: 97.9 F (36.6 C) 97.7 F (36.5 C) 98.1 F (36.7 C) 97.6 F (36.4 C)  TempSrc: Oral Oral Oral Oral  SpO2: 100% 98% 99% 97%  Weight:      Height:        General: Not in pain or dyspnea.  Neurology: Awake and alert, non focal  E ENT: no pallor, no icterus, oral mucosa moist Cardiovascular: No JVD. S1-S2 present, rhythmic, no gallops, rubs, or murmurs. No lower extremity edema. Pulmonary: positive breath sounds bilaterally, adequate air movement, no wheezing, rhonchi or rales. Gastrointestinal. Abdomen with, no organomegaly, non tender, no rebound or guarding Skin. No rashes Musculoskeletal: no joint deformities   The results of significant diagnostics from this hospitalization (including imaging, microbiology, ancillary and laboratory) are listed below for reference.     Microbiology: Recent Results (from the past 240 hour(s))  Respiratory Panel by RT PCR (Flu A&B, Covid) - Nasopharyngeal Swab     Status: None   Collection Time: 08/28/19  2:51 PM   Specimen: Nasopharyngeal Swab  Result Value Ref Range Status   SARS Coronavirus 2 by RT PCR NEGATIVE NEGATIVE Final    Comment: (NOTE) SARS-CoV-2 target nucleic acids are NOT DETECTED. The SARS-CoV-2 RNA is generally detectable in upper respiratoy specimens during the acute phase of infection. The lowest concentration of SARS-CoV-2 viral copies this assay can detect is 131 copies/mL. A negative result does not preclude SARS-Cov-2 infection and should not be used as the sole basis for treatment or other patient management decisions. A negative result may occur with  improper specimen collection/handling, submission of specimen other than nasopharyngeal swab, presence of viral mutation(s) within the areas targeted by this assay, and inadequate number of viral copies (<131 copies/mL). A  negative result must be combined with clinical observations, patient history, and epidemiological information. The expected result is Negative. Fact Sheet for Patients:  08/30/19 Fact Sheet for Healthcare Providers:  https://www.moore.com/ This test is not yet ap proved or cleared by the https://www.young.biz/ FDA and  has been authorized for detection and/or diagnosis of SARS-CoV-2 by FDA under an Emergency Use Authorization (EUA). This EUA will remain  in effect (meaning this test can be used) for the duration of the COVID-19 declaration under Section 564(b)(1) of the Act, 21 U.S.C. section 360bbb-3(b)(1), unless the authorization is terminated or revoked sooner.    Influenza A by PCR NEGATIVE NEGATIVE Final   Influenza B by PCR NEGATIVE NEGATIVE Final    Comment: (NOTE) The Xpert Xpress SARS-CoV-2/FLU/RSV assay is intended as an aid in  the diagnosis of influenza from Nasopharyngeal swab specimens and  should not be used as a sole basis for treatment. Nasal washings and  aspirates are unacceptable for Xpert Xpress SARS-CoV-2/FLU/RSV  testing. Fact  Sheet for Patients: https://www.moore.com/https://www.fda.gov/media/142436/download Fact Sheet for Healthcare Providers: https://www.young.biz/https://www.fda.gov/media/142435/download This test is not yet approved or cleared by the [place] FDA and  has been authorized for detection and/or diagnosis of SARS-CoV-2 by  FDA under an Emergency Use Authorization (EUA). This EUA will remain  in effect (meaning this test can be used) for the duration of the  Covid-19 declaration under Section 564(b)(1) of the Act, 21  U.S.C. section 360bbb-3(b)(1), unless the authorization is  terminated or revoked. Performed at [facility], [address] [place], [place] [address]      Labs: BNP (last 3 results) No results for input(s): BNP in the last 8760 hours. Basic Metabolic Panel: Recent Labs  Lab 08/28/19 0952 08/29/19 0110   NA 144 143  K 3.6 3.9  CL 111 110  CO2 25 24  GLUCOSE 79 115*  BUN 5* 7  CREATININE 0.70 0.60  CALCIUM 9.1 8.7*  MG 1.9  --    Liver Function Tests: No results for input(s): AST, ALT, ALKPHOS, BILITOT, PROT, ALBUMIN in the last 168 hours. No results for input(s): LIPASE, AMYLASE in the last 168 hours. No results for input(s): AMMONIA in the last 168 hours. CBC: Recent Labs  Lab 08/28/19 0952 08/29/19 0110  WBC 4.7 5.7  NEUTROABS 1.8  --   HGB 12.8 12.3  HCT 40.7 38.2  MCV 92.3 90.5  PLT 278 274   Cardiac Enzymes: No results for input(s): CKTOTAL, CKMB, CKMBINDEX, TROPONINI in the last 168 hours. BNP: Invalid input(s): POCBNP CBG: Recent Labs  Lab 08/28/19 1941 08/29/19 0012 08/29/19 0408 08/29/19 0751 08/29/19 1200  GLUCAP 102* 119* 100* 102* 111*   D-Dimer Recent Labs    08/28/19 1833  DDIMER 0.34   Hgb A1c No results for input(s): HGBA1C in the last 72 hours. Lipid Profile No results for input(s): CHOL, HDL, LDLCALC, TRIG, CHOLHDL, LDLDIRECT in the last 72 hours. Thyroid function studies Recent Labs    08/28/19 1451  TSH 0.644   Anemia work up No results for input(s): VITAMINB12, FOLATE, FERRITIN, TIBC, IRON, RETICCTPCT in the last 72 hours. Urinalysis    Component Value Date/Time   COLORURINE STRAW (A) 08/28/2019 1015   APPEARANCEUR CLEAR 08/28/2019 1015   LABSPEC 1.003 (L) 08/28/2019 1015   PHURINE 6.0 08/28/2019 1015   GLUCOSEU NEGATIVE 08/28/2019 1015   HGBUR NEGATIVE 08/28/2019 1015   BILIRUBINUR NEGATIVE 08/28/2019 1015   KETONESUR NEGATIVE 08/28/2019 1015   PROTEINUR NEGATIVE 08/28/2019 1015   NITRITE NEGATIVE 08/28/2019 1015   LEUKOCYTESUR NEGATIVE 08/28/2019 1015   Sepsis Labs Invalid input(s): PROCALCITONIN,  WBC,  LACTICIDVEN Microbiology Recent Results (from the past 240 hour(s))  Respiratory Panel by RT PCR (Flu A&B, Covid) - Nasopharyngeal Swab     Status: None   Collection Time: 08/28/19  2:51 PM   Specimen:  Nasopharyngeal Swab  Result Value Ref Range Status   SARS Coronavirus 2 by RT PCR NEGATIVE NEGATIVE Final    Comment: (NOTE) SARS-CoV-2 target nucleic acids are NOT DETECTED. The SARS-CoV-2 RNA is generally detectable in upper respiratoy specimens during the acute phase of infection. The lowest concentration of SARS-CoV-2 viral copies this assay can detect is 131 copies/mL. A negative result does not preclude SARS-Cov-2 infection and should not be used as the sole basis for treatment or other patient management decisions. A negative result may occur with  improper specimen collection/handling, submission of specimen other than nasopharyngeal swab, presence of viral mutation(s) within the areas targeted by this assay, and  inadequate number of viral copies (<131 copies/mL). A negative result must be combined with clinical observations, patient history, and epidemiological information. The expected result is Negative. Fact Sheet for Patients:  https://www.moore.com/ Fact Sheet for Healthcare Providers:  https://www.young.biz/ This test is not yet ap proved or cleared by the [place] FDA and  has been authorized for detection and/or diagnosis of SARS-CoV-2 by FDA under an Emergency Use Authorization (EUA). This EUA will remain  in effect (meaning this test can be used) for the duration of the COVID-19 declaration under Section 564(b)(1) of the Act, 21 U.S.C. section 360bbb-3(b)(1), unless the authorization is terminated or revoked sooner.    Influenza A by PCR NEGATIVE NEGATIVE Final   Influenza B by PCR NEGATIVE NEGATIVE Final    Comment: (NOTE) The Xpert Xpress SARS-CoV-2/FLU/RSV assay is intended as an aid in  the diagnosis of influenza from Nasopharyngeal swab specimens and  should not be used as a sole basis for treatment. Nasal washings and  aspirates are unacceptable for Xpert Xpress SARS-CoV-2/FLU/RSV  testing. Fact Sheet for  Patients: https://www.moore.com/ Fact Sheet for Healthcare Providers: https://www.young.biz/ This test is not yet approved or cleared by the [place] FDA and  has been authorized for detection and/or diagnosis of SARS-CoV-2 by  FDA under an Emergency Use Authorization (EUA). This EUA will remain  in effect (meaning this test can be used) for the duration of the  Covid-19 declaration under Section 564(b)(1) of the Act, 21  U.S.C. section 360bbb-3(b)(1), unless the authorization is  terminated or revoked. Performed at [facility], [address], [place], [place] [address]      Time coordinating discharge: 45 minutes  SIGNED:   Coralie Keens, MD  [facility] 08/29/2019, 2:31 PM

## 2019-08-29 NOTE — Progress Notes (Signed)
  Echocardiogram 2D Echocardiogram has been performed.  Gerda Diss 08/29/2019, 12:00 PM

## 2021-06-11 IMAGING — DX DG CHEST 1V PORT
1 series · 1 of 1 positions shown · non-contrast
Comparison: None.

CLINICAL DATA: Patient with syncopal episode.

EXAM:
PORTABLE CHEST 1 VIEW

[chest ap]
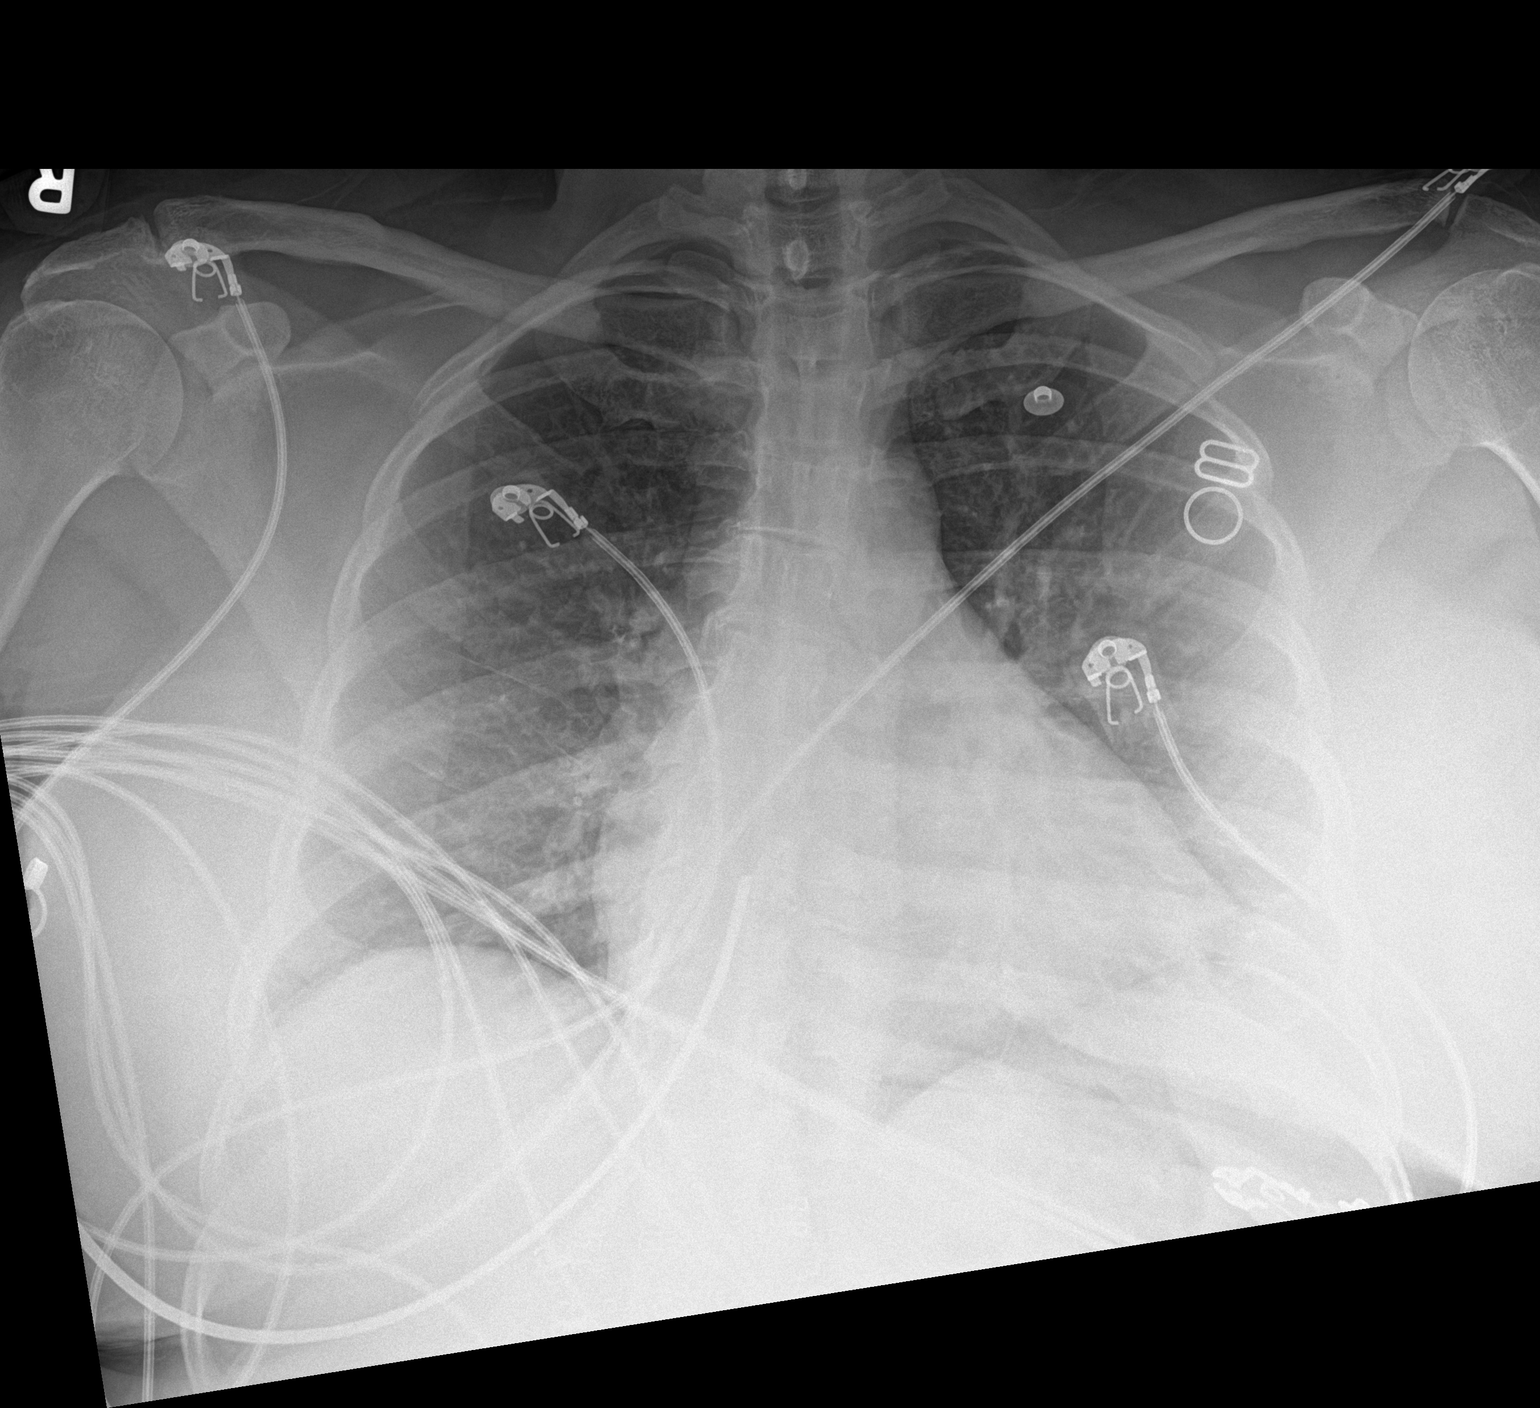

[1 of 1 positions shown; findings below may reference images not displayed]

FINDINGS: Monitoring leads overlie the patient. Cardiac contours upper limits
of normal. Low lung volumes. Bibasilar heterogeneous opacities. No
pleural effusion or pneumothorax. Rightward curvature of the
thoracic spine.
IMPRESSION: Low lung volumes with basilar opacities favored to represent
atelectasis.

## 2021-09-08 ENCOUNTER — Encounter (HOSPITAL_COMMUNITY): Payer: Self-pay

## 2021-09-08 ENCOUNTER — Ambulatory Visit (HOSPITAL_COMMUNITY)
Admission: EM | Admit: 2021-09-08 | Discharge: 2021-09-08 | Disposition: A | Discharge status: Home / Self Care | Payer: No Typology Code available for payment source | Attending: Family Medicine | Admitting: Family Medicine

## 2021-09-08 ENCOUNTER — Ambulatory Visit (INDEPENDENT_AMBULATORY_CARE_PROVIDER_SITE_OTHER): Payer: No Typology Code available for payment source

## 2021-09-08 DIAGNOSIS — M79644 Pain in right finger(s): Secondary | ICD-10-CM

## 2021-09-08 MED ORDER — IBUPROFEN 800 MG PO TABS: 800.0000 mg | ORAL_TABLET | Freq: Three times a day (TID) | ORAL | 0 refills | Status: AC | PRN

## 2021-09-08 NOTE — Discharge Instructions (Addendum)
X-ray is negative for fracture. ? ?Take ibuprofen 800 mg--1 tab every 8 hours as needed for pain.  ? ?Ice and elevate that hand today ?
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
## 2023-06-23 IMAGING — DX DG FINGER LITTLE 2+V*R*
3 series · 3 of 3 positions shown · non-contrast
Comparison: None Available.

CLINICAL DATA: Injury pain

EXAM:
RIGHT LITTLE FINGER 2+V

[finger ap]
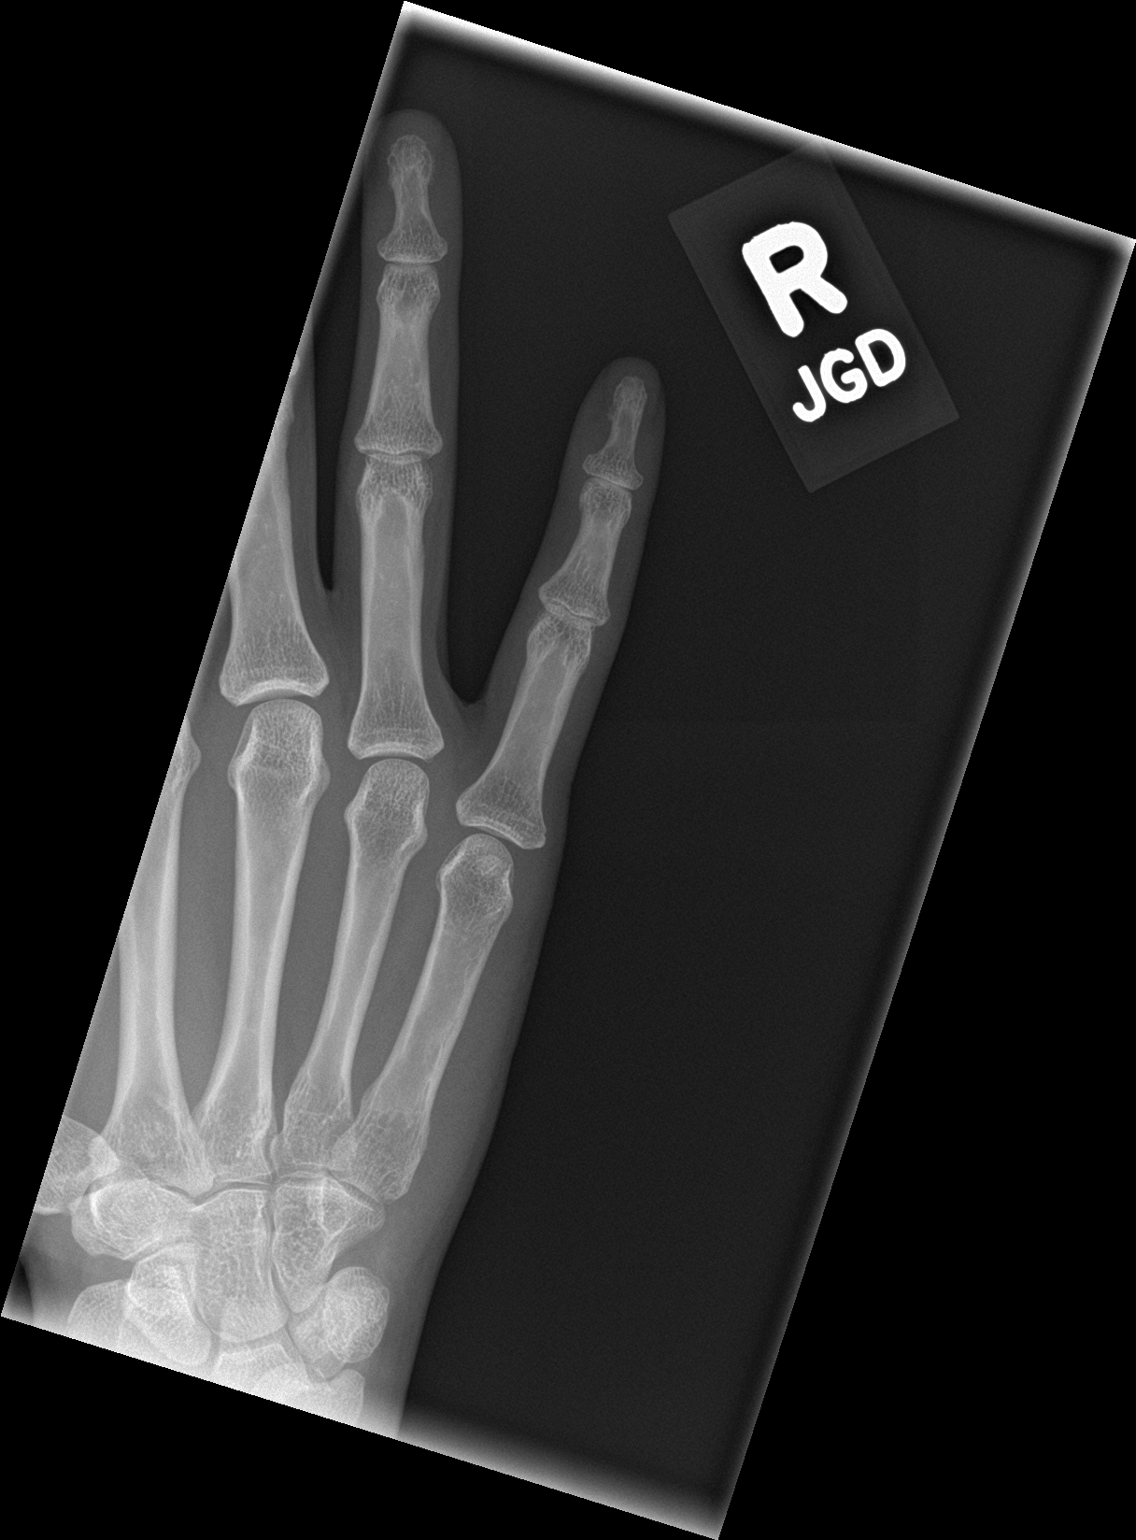

[finger obl]
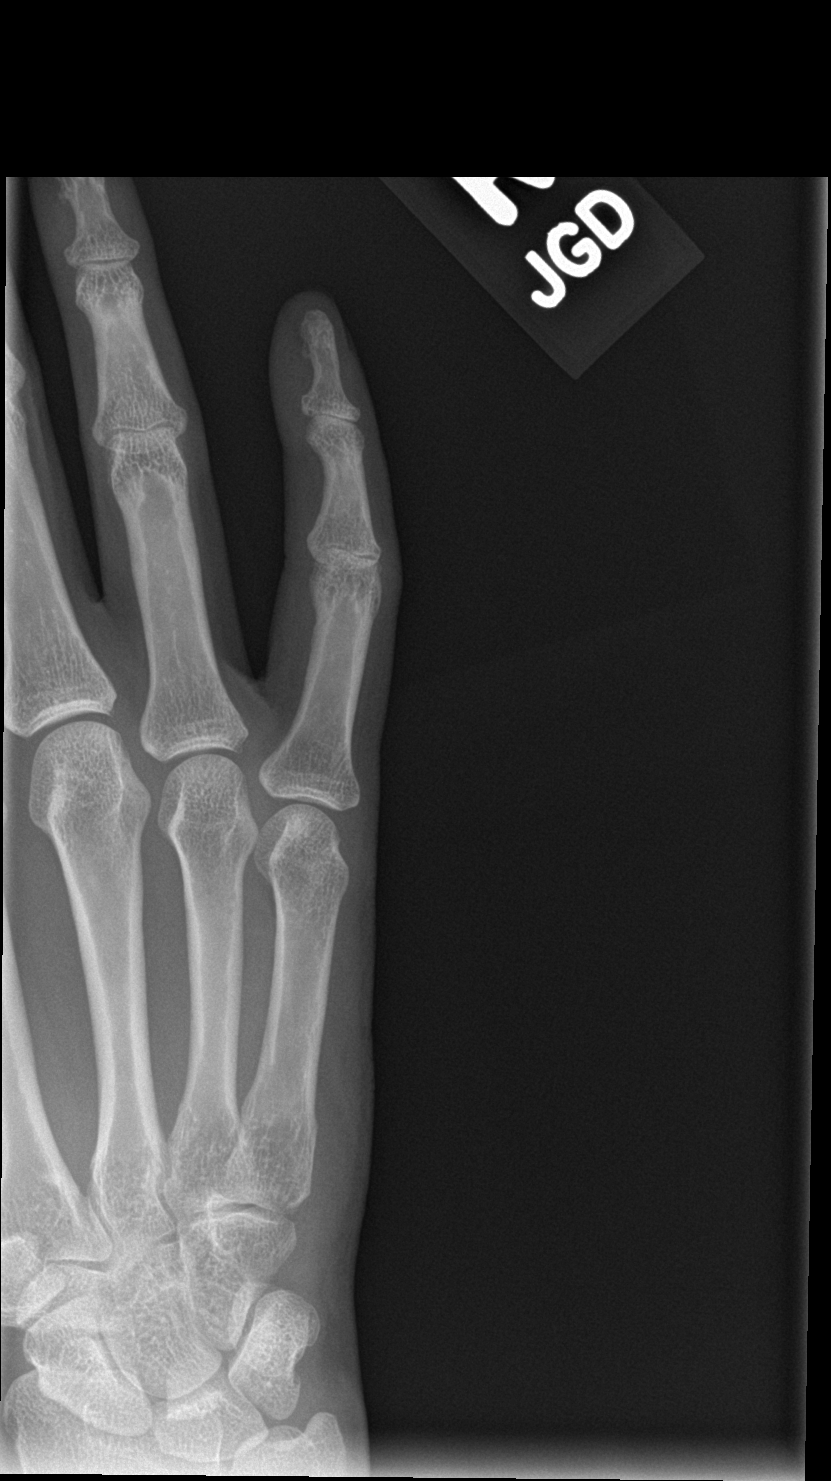

[finger lat]
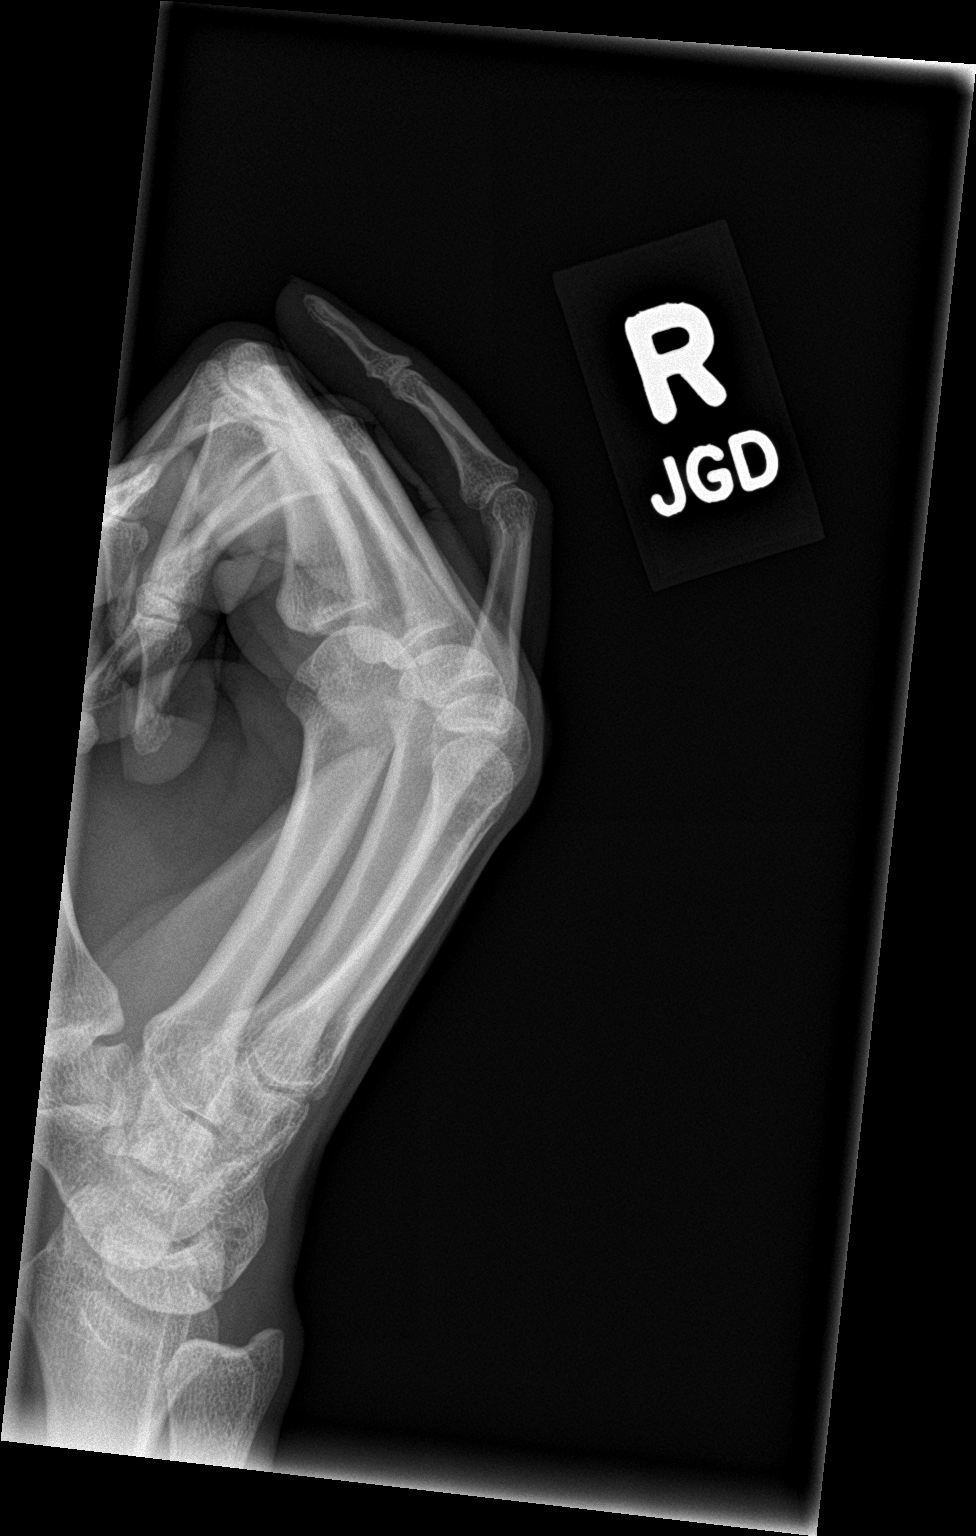

[3 of 3 positions shown; findings below may reference images not displayed]

FINDINGS: There is no evidence of fracture or dislocation. There is no
evidence of arthropathy or other focal bone abnormality. Soft
tissues are unremarkable.
IMPRESSION: Negative.
